# Patient Record
Sex: Male | Born: 1995 | Race: Black or African American | Hispanic: No | Marital: Single | Smoking: Never smoker
Health system: Southern US, Community
[De-identification: ages and names within clinical notes are randomized; demographics above are authoritative.]

## PROBLEM LIST (undated history)

## (undated) DIAGNOSIS — S83209A Unspecified tear of unspecified meniscus, current injury, unspecified knee, initial encounter: Secondary | ICD-10-CM

## (undated) DIAGNOSIS — R55 Syncope and collapse: Secondary | ICD-10-CM

## (undated) HISTORY — PX: NO PAST SURGERIES: SHX2092

## (undated) HISTORY — DX: Unspecified tear of unspecified meniscus, current injury, unspecified knee, initial encounter: S83.209A

## (undated) HISTORY — DX: Syncope and collapse: R55

---
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
## 2016-04-26 ENCOUNTER — Other Ambulatory Visit: Payer: Self-pay | Admitting: Orthopedic Surgery

## 2016-04-26 DIAGNOSIS — M25561 Pain in right knee: Secondary | ICD-10-CM

## 2016-04-28 ENCOUNTER — Ambulatory Visit
Admission: RE | Admit: 2016-04-28 | Discharge: 2016-04-28 | Disposition: A | Discharge status: Home / Self Care | Payer: Self-pay | Source: Ambulatory Visit | Attending: Orthopedic Surgery | Admitting: Orthopedic Surgery

## 2016-04-28 DIAGNOSIS — M25561 Pain in right knee: Secondary | ICD-10-CM

## 2016-05-05 ENCOUNTER — Emergency Department (HOSPITAL_COMMUNITY)
Admission: EM | Admit: 2016-05-05 | Discharge: 2016-05-06 | Disposition: A | Discharge status: Home / Self Care | Payer: PPO | Attending: Emergency Medicine | Admitting: Emergency Medicine

## 2016-05-05 ENCOUNTER — Emergency Department (HOSPITAL_COMMUNITY): Payer: PPO

## 2016-05-05 ENCOUNTER — Encounter (HOSPITAL_COMMUNITY): Payer: Self-pay

## 2016-05-05 DIAGNOSIS — R55 Syncope and collapse: Secondary | ICD-10-CM | POA: Insufficient documentation

## 2016-05-05 DIAGNOSIS — Y929 Unspecified place or not applicable: Secondary | ICD-10-CM | POA: Diagnosis not present

## 2016-05-05 DIAGNOSIS — S0993XA Unspecified injury of face, initial encounter: Secondary | ICD-10-CM | POA: Diagnosis present

## 2016-05-05 DIAGNOSIS — Z79899 Other long term (current) drug therapy: Secondary | ICD-10-CM | POA: Insufficient documentation

## 2016-05-05 DIAGNOSIS — Y9301 Activity, walking, marching and hiking: Secondary | ICD-10-CM | POA: Insufficient documentation

## 2016-05-05 DIAGNOSIS — S0512XA Contusion of eyeball and orbital tissues, left eye, initial encounter: Secondary | ICD-10-CM | POA: Insufficient documentation

## 2016-05-05 DIAGNOSIS — W109XXA Fall (on) (from) unspecified stairs and steps, initial encounter: Secondary | ICD-10-CM | POA: Insufficient documentation

## 2016-05-05 DIAGNOSIS — Y999 Unspecified external cause status: Secondary | ICD-10-CM | POA: Insufficient documentation

## 2016-05-05 LAB — BASIC METABOLIC PANEL
ANION GAP: 9 (ref 5–15)
BUN: 10 mg/dL (ref 6–20)
CALCIUM: 9.2 mg/dL (ref 8.9–10.3)
CO2: 24 mmol/L (ref 22–32)
CREATININE: 1.31 mg/dL — AB (ref 0.61–1.24)
Chloride: 104 mmol/L (ref 101–111)
GFR calc Af Amer: >60 mL/min (ref >60)
GLUCOSE: 98 mg/dL (ref 65–99)
Potassium: 3.4 mmol/L — ABNORMAL LOW (ref 3.5–5.1)
Sodium: 137 mmol/L (ref 135–145)

## 2016-05-05 LAB — ETHANOL

## 2016-05-05 LAB — CBC
HCT: 42.6 % (ref 39.0–52.0)
Hemoglobin: 14.1 g/dL (ref 13.0–17.0)
MCH: 27.9 pg (ref 26.0–34.0)
MCHC: 33.1 g/dL (ref 30.0–36.0)
MCV: 84.2 fL (ref 78.0–100.0)
PLATELETS: 252 10*3/uL (ref 150–400)
RBC: 5.06 MIL/uL (ref 4.22–5.81)
RDW: 12.8 % (ref 11.5–15.5)
WBC: 6.3 10*3/uL (ref 4.0–10.5)

## 2016-05-05 MED ORDER — SODIUM CHLORIDE 0.9 % IV BOLUS (SEPSIS)
1000.0000 mL | Freq: Once | INTRAVENOUS | Status: AC
  Administered 2016-05-05: 1000 mL via INTRAVENOUS

## 2016-05-05 NOTE — ED Notes (Signed)
Pt transported to CT ?

## 2016-05-05 NOTE — ED Triage Notes (Signed)
Pt brought in by [facility]. Pt was walking down stairs when he became diaphoretic and patient had syncopal episode with seizure like activity following as per EMS. Per EMS, pt was caught before hitting floor and patient was confused on transport. Pt was tachycardic at 175 on transport and EMS performed vagal maneuvers to lower Hr which were effective.  Pt denies head strike or any other injuries. Pt alert and oriented with no past medical hx.

## 2016-05-05 NOTE — ED Provider Notes (Signed)
[facility]-EMERGENCY DEPT Provider Note   CSN: [id number] Arrival date & time: 05/05/16  2212     History   Chief Complaint Chief Complaint  Patient presents with  . Loss of Consciousness    HPI Brandon Chapman is a 20 y.o. male.  HPI   Patient has no significant PMH, weights 147.4 kg comes to the ED after a syncopal episode. He was at the [employer] basketball game, reports it being an intense game and really hot, pt was sweating. After the game as they were leaving he was going to go down the stairs and the last thing the patient remembers is feeling weak and then passing out. The patients friend who witnessed the episode says that his eyes rolled in the back of his head and that his legs were twitching, his friend could not describe this any better because he says he was looking at his phone to call 9-1-1. The patient hit the left side of his face when he fell. After 2-3 minutes he woke up, and then friend says he fell asleep snoring, then he woke up and was laughing, and then fell asleep snoring. Patient only remembers waking up in ambulance. Now at baseline with no complaints. Family is here, no fm hx of seizures. Mom says she has hx of multiple syncopal episodes at work but no etiology has been identified.    History reviewed. No pertinent past medical history.  Patient Active Problem List   Diagnosis Date Noted  . Right ankle sprain 03/26/2011    History reviewed. No pertinent surgical history.     Home Medications    Prior to Admission medications   Not on File    Family History Family History  Problem Relation Age of Onset  . Hypertension Father   . Sudden death Neg Hx   . Hyperlipidemia Neg Hx   . Heart attack Neg Hx   . Diabetes Neg Hx     Social History Social History  Substance Use Topics  . Smoking status: Never Smoker  . Smokeless tobacco: Never Used  . Alcohol use No     Allergies   Patient has no known allergies.   Review of Systems Review of  Systems  Review of Systems  Constitutional: Negative for fever and chills.  HENT: Negative for neck pain.  Cardiovascular: Negative for leg swelling.  Gastrointestinal: Negative for nausea and vomiting.  No incontinence of bowel  Genitourinary: Negative for difficulty urinating.  No incontinence or retention  Musculoskeletal: negative for back pain.  Skin: Negative for rash.  Neurological: negative for numbness. Negative for weakness.   Physical Exam Updated Vital Signs BP 115/57   Pulse 71   Temp 98.2 F (36.8 C) (Oral)   Resp 23   Ht 6\' 1"  (1.854 m)   Wt (!) 147.4 kg   SpO2 98%   BMI 42.88 kg/m   Physical Exam  Constitutional: He appears well-developed and well-nourished. No distress.  HENT:  Head: Normocephalic. Head is with contusion (left periorbital).  Right Ear: Tympanic membrane and ear canal normal.  Left Ear: Tympanic membrane and ear canal normal.  Nose: Nose normal.  Mouth/Throat: Uvula is midline, oropharynx is clear and moist and mucous membranes are normal.  Eyes: Pupils are equal, round, and reactive to light.  Neck: Normal range of motion. Neck supple.  Cardiovascular: Normal rate and regular rhythm.   Pulmonary/Chest: Effort normal.  Abdominal: Soft.  No signs of abdominal distention  Musculoskeletal:  No LE swelling  Neurological:  He is alert.  Acting at baseline Cranial nerves grossly intact on exam. Pt alert and oriented x 3 Upper and lower extremity strength is symmetrical and physiologic Normal muscular tone No facial droop Coordination intact, no limb ataxia,No pronator drift   Skin: Skin is warm and dry. No rash noted.  Nursing note and vitals reviewed.     ED Treatments / Results  Labs (all labs ordered are listed, but only abnormal results are displayed) Labs Reviewed  BASIC METABOLIC PANEL - Abnormal; Notable for the following:       Result Value   Potassium 3.4 (*)    Creatinine, Ser 1.31 (*)    All other components within  normal limits  URINALYSIS, ROUTINE W REFLEX MICROSCOPIC (NOT AT [facility]) - Abnormal; Notable for the following:    Protein, ur 100 (*)    All other components within normal limits  RAPID URINE DRUG SCREEN, HOSP PERFORMED - Abnormal; Notable for the following:    Tetrahydrocannabinol POSITIVE (*)    All other components within normal limits  URINE MICROSCOPIC-ADD ON - Abnormal; Notable for the following:    Bacteria, UA RARE (*)    Casts HYALINE CASTS (*)    Crystals CA OXALATE CRYSTALS (*)    All other components within normal limits  CBC  ETHANOL    EKG  EKG Interpretation  Date/Time:  Wednesday May 05 2016 22:22:53 EST Ventricular Rate:  97 PR Interval:    QRS Duration: 96 QT Interval:  333 QTC Calculation: 423 R Axis:   -66 Text Interpretation:  Sinus rhythm LAD, consider left anterior fascicular block ST elev, probable normal early repol pattern No old tracing to compare Confirmed by WARD,  DO, KRISTEN ([id number]) on 05/06/2016 12:56:26 AM       Radiology Dg Chest 2 View  Result Date: 05/06/2016 CLINICAL DATA:  Acute onset of shortness of breath. Syncope. Initial encounter. EXAM: CHEST  2 VIEW COMPARISON:  None. FINDINGS: The lungs are well-aerated. Pulmonary vascularity is at the upper limits of normal. There is no evidence of focal opacification, pleural effusion or pneumothorax. The heart is borderline normal in size. No acute osseous abnormalities are seen. IMPRESSION: No acute cardiopulmonary process seen. Electronically Signed   By: Roanna RaiderJeffery  Chang M.D.   On: 05/06/2016 00:26   Ct Head Wo Contrast  Result Date: 05/05/2016 CLINICAL DATA:  Syncope. EXAM: CT HEAD WITHOUT CONTRAST TECHNIQUE: Contiguous axial images were obtained from the base of the skull through the vertex without intravenous contrast. COMPARISON:  None. FINDINGS: Brain: No evidence of acute infarction, hemorrhage, hydrocephalus, extra-axial collection or mass lesion/mass effect. Gray matter and white  matter appear unremarkable, with normal differentiation. Vascular: No hyperdense vessel or unexpected calcification. Skull: Normal. Negative for fracture or focal lesion. Sinuses/Orbits: No acute finding. Other: None. IMPRESSION: Normal brain. Electronically Signed   By: Ellery Plunkaniel R Mitchell M.D.   On: 05/05/2016 23:53    Procedures Procedures (including critical care time)  Medications Ordered in ED Medications  sodium chloride 0.9 % bolus 1,000 mL (0 mLs Intravenous Stopped 05/06/16 0142)     Initial Impression / Assessment and Plan / ED Course  I have reviewed the triage vital signs and the nursing notes.  Pertinent labs & imaging results that were available during my care of the patient were reviewed by me and considered in my medical decision making (see chart for details).  Clinical Course     The patient's head CT and chest x-ray are unremarkable. BMP and CBC overall  unremarkable urinalysis unremarkable. EKG reviewed with Dr. Elesa MassedWard no concerning findings. Based on history of present illness is unclear whether patient had full episode, seizure, cardiac event. Dr. Elesa MassedWard and I have low suspicion that seizure activity or cardiac event took place and sounds like the patient got overheated and passed out. We will recommend he follow-up with cardiology and neurology and referrals will be given today. Also given the patient and parents the recommendation that he does not drive until cleared by neurology for 6 months being seizure or syncope free.  They voiced understanding about the dangers of him operating heavy machinery. Discussed return precautions.  Final Clinical Impressions(s) / ED Diagnoses   Final diagnoses:  Syncope, unspecified syncope type    New Prescriptions New Prescriptions   No medications on file     Marlon Peliffany Olden Klauer, PA-C 05/06/16 0204    Layla MawKristen N Ward, DO 05/06/16 [phone]

## 2016-05-06 ENCOUNTER — Emergency Department (HOSPITAL_COMMUNITY): Payer: PPO

## 2016-05-06 DIAGNOSIS — S0512XA Contusion of eyeball and orbital tissues, left eye, initial encounter: Secondary | ICD-10-CM | POA: Diagnosis not present

## 2016-05-06 LAB — RAPID URINE DRUG SCREEN, HOSP PERFORMED
Amphetamines: NOT DETECTED
BENZODIAZEPINES: NOT DETECTED
Barbiturates: NOT DETECTED
COCAINE: NOT DETECTED
OPIATES: NOT DETECTED
Tetrahydrocannabinol: POSITIVE — AB

## 2016-05-06 LAB — URINE MICROSCOPIC-ADD ON: SQUAMOUS EPITHELIAL / LPF: NONE SEEN

## 2016-05-06 LAB — URINALYSIS, ROUTINE W REFLEX MICROSCOPIC
Bilirubin Urine: NEGATIVE
GLUCOSE, UA: NEGATIVE mg/dL
Hgb urine dipstick: NEGATIVE
KETONES UR: NEGATIVE mg/dL
LEUKOCYTES UA: NEGATIVE
NITRITE: NEGATIVE
PROTEIN: 100 mg/dL — AB
Specific Gravity, Urine: 1.028 (ref 1.005–1.030)
pH: 7 (ref 5.0–8.0)

## 2016-05-06 NOTE — ED Notes (Signed)
Pt stable, understands discharge instructions, and reasons for return.   

## 2016-05-10 ENCOUNTER — Ambulatory Visit (INDEPENDENT_AMBULATORY_CARE_PROVIDER_SITE_OTHER): Payer: PRIVATE HEALTH INSURANCE | Admitting: Neurology

## 2016-05-10 ENCOUNTER — Encounter: Payer: Self-pay | Admitting: Neurology

## 2016-05-10 VITALS — BP 146/73 | HR 57 | Ht 73.0 in | Wt 334.0 lb

## 2016-05-10 DIAGNOSIS — R55 Syncope and collapse: Secondary | ICD-10-CM | POA: Diagnosis not present

## 2016-05-10 NOTE — Patient Instructions (Signed)
   We will get MRI of the brain and get an EEG study. 

## 2016-05-10 NOTE — Progress Notes (Signed)
Reason for visit: Syncope  Referring physician: [place]  Brandon Chapman is a 20 y.o. male  History of present illness:  Brandon Chapman is a 20 year old right-handed black male with a history of a syncopal event that occurred on 05/05/2016. The patient was at a sports game, in a hot stadium. The patient was standing during the game, after the game he went to leave, but he shortly felt somewhat lightheaded, he felt numbness on the right face, arm, and leg and felt as if his right face was drooping. The patient recalls dimming of vision, and then he subsequently lost consciousness. The patient was noted to be diaphoretic, he was with his friend who noted that he was twitching on the ground, he seemed to come in and out of consciousness with some agitation. When EMS arrived, they noted that the heart rate was significantly increased. The patient was brought to the hospital, a CT scan of the brain was done. The patient had struck the left brow with the fall. The patient denies any prior episodes of blacking out, he denies any history of significant head trauma. His mother has had several syncopal events, the etiology was never determined. The patient had blood work that was unremarkable, a urine drug screen was positive for THC, otherwise negative. The patient is sent to this office for an evaluation.  Past Medical History:  Diagnosis Date  . Torn meniscus    Right knee    Past Surgical History:  Procedure Laterality Date  . NO PAST SURGERIES      Family History  Problem Relation Age of Onset  . Hypertension Father   . Asthma Mother   . Diabetes Maternal Grandmother   . Sudden death Neg Hx   . Hyperlipidemia Neg Hx   . Heart attack Neg Hx     Social history:  reports that he has never smoked. He has never used smokeless tobacco. He reports that he drinks alcohol. He reports that he does not use drugs.  Medications:  Prior to Admission medications   Medication Sig Start Date End Date  Taking? Authorizing Provider  ibuprofen (ADVIL,MOTRIN) 200 MG tablet Take 200 mg by mouth every 6 (six) hours as needed.   Yes Historical Provider, MD     No Known Allergies  ROS:  Out of a complete 14 system review of symptoms, the patient complains only of the following symptoms, and all other reviewed systems are negative.  Blurred vision Shortness of breath Joint pain, muscle cramps Snoring  Blood pressure (!) 146/73, pulse (!) 57, height 6\' 1"  (1.854 m), weight (!) 334 lb (151.5 kg).  Physical Exam  General: The patient is alert and cooperative at the time of the examination. The patient is moderately obese.  Eyes: Pupils are equal, round, and reactive to light. Discs are flat bilaterally.  Neck: The neck is supple, no carotid bruits are noted.  Respiratory: The respiratory examination is clear.  Cardiovascular: The cardiovascular examination reveals a regular rate and rhythm, no obvious murmurs or rubs are noted.  Skin: Extremities are without significant edema.  Neurologic Exam  Mental status: The patient is alert and oriented x 3 at the time of the examination. The patient has apparent normal recent and remote memory, with an apparently normal attention span and concentration ability.  Cranial nerves: Facial symmetry is present. There is good sensation of the face to pinprick and soft touch bilaterally. The strength of the facial muscles and the muscles to head turning  and shoulder shrug are normal bilaterally. Speech is well enunciated, no aphasia or dysarthria is noted. Extraocular movements are full. Visual fields are full. The tongue is midline, and the patient has symmetric elevation of the soft palate. No obvious hearing deficits are noted.  Motor: The motor testing reveals 5 over 5 strength of all 4 extremities. Good symmetric motor tone is noted throughout.  Sensory: Sensory testing is intact to pinprick, soft touch, vibration sensation, and position sense on  all 4 extremities. No evidence of extinction is noted.  Coordination: Cerebellar testing reveals good finger-nose-finger and heel-to-shin bilaterally.  Gait and station: Gait is normal. Tandem gait is normal. Romberg is negative. No drift is seen.  Reflexes: Deep tendon reflexes are symmetric and normal bilaterally. Toes are downgoing bilaterally.   Assessment/Plan:  1. Syncope versus seizure  The patient has had an event of loss of consciousness, it was associated with diaphoresis and dimming of vision prior to lost consciousness. These features appear to be consistent with vasovagal syncope, but the patient was apparently noted to have jerking or twitching after falling to the ground. He also had associated agitation. A seizure does need to be considered. Given the elevated heart rate noted by EMS, the patient should undergo a cardiology evaluation as well. The patient will be set up for MRI of the brain, and an EEG study. He will follow-up in 5 months, he will let me know if he has another syncopal event. He is not to drive for 6 months following the episode above.  C. Keith Willis MD 05/10/2016 9:44 AM  [facility] [address] [place], [place] [id number]  Phone [phone] Fax [phone]

## 2016-05-12 ENCOUNTER — Ambulatory Visit (INDEPENDENT_AMBULATORY_CARE_PROVIDER_SITE_OTHER): Payer: PRIVATE HEALTH INSURANCE | Admitting: Neurology

## 2016-05-12 ENCOUNTER — Telehealth: Payer: Self-pay | Admitting: Neurology

## 2016-05-12 DIAGNOSIS — R55 Syncope and collapse: Secondary | ICD-10-CM | POA: Diagnosis not present

## 2016-05-12 NOTE — Procedures (Signed)
    History:  Brandon Chapman is a 20 year old gentleman with a history of a blackout event associated with diaphoresis, but the patient may have had some jerking with the event. The patient is being evaluated for syncope versus seizure.  This is a routine EEG. No skull defects are noted. Medications include ibuprofen.   EEG classification: Normal awake and drowsy  Description of the recording: The background rhythms of this recording consists of a fairly well modulated medium amplitude alpha rhythm of 10 Hz that is reactive to eye opening and closure. As the record progresses, the patient appears to remain in the waking state throughout the recording. Photic stimulation was performed, resulting in a bilateral and symmetric photic driving response. Hyperventilation was also performed, resulting in a minimal buildup of the background rhythm activities without significant slowing seen. Toward the end of the recording, the patient enters the drowsy state with slight symmetric slowing seen. The patient never enters stage II sleep. At no time during the recording does there appear to be evidence of spike or spike wave discharges or evidence of focal slowing. EKG monitor shows no evidence of cardiac rhythm abnormalities with a heart rate of 66.  Impression: This is a normal EEG recording in the waking and drowsy state. No evidence of ictal or interictal discharges are seen.

## 2016-05-12 NOTE — Telephone Encounter (Signed)
I called the patient. The EEG study was normal. MRI of the brain will be done.

## 2016-05-21 ENCOUNTER — Encounter: Payer: Self-pay | Admitting: Cardiology

## 2016-05-21 ENCOUNTER — Ambulatory Visit (INDEPENDENT_AMBULATORY_CARE_PROVIDER_SITE_OTHER): Payer: PRIVATE HEALTH INSURANCE | Admitting: Cardiology

## 2016-05-21 VITALS — BP 126/73 | HR 73 | Ht 73.0 in | Wt 337.0 lb

## 2016-05-21 DIAGNOSIS — R55 Syncope and collapse: Secondary | ICD-10-CM

## 2016-05-21 DIAGNOSIS — R9431 Abnormal electrocardiogram [ECG] [EKG]: Secondary | ICD-10-CM

## 2016-05-21 HISTORY — PX: TRANSTHORACIC ECHOCARDIOGRAM: SHX275

## 2016-05-21 HISTORY — PX: KNEE SURGERY: SHX244

## 2016-05-21 NOTE — Progress Notes (Signed)
PCP: Tarri FullerESCAJEDA, RICHARD, MD  Clinic Note: Chief Complaint  Patient presents with  . Hospitalization Follow-up    ER visit for Syncope    HPI: Brandon Chapman is a 20 y.o. male with a PMH below who presents today for cardiology evaluation for syncope. He went to the emergency room on November 15 for syncope. 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   The episode was witnessed with a friend noted seeing the patient begin to feel weak and the eyes rolled back and the back of his head. This was after watching an intense game basketball. There is suggestion that the patient felt numbness in his right face arm and leg and also some right face was drooping. He was pale and diaphoretic prior to loss of consciousness. On EMS arrival he was noted to be actually tachycardic. By his mother's report, she has had multiple syncopal episodes but no etiology determined. There does not seem to be a family history of seizures.  Brandon Chapman was seen in posterior follow-up by Dr. Anne HahnWillis from neurology on November 20. He has an MRI of the brain and EEG ordered. No driving for 6 months  Recent Hospitalizations: November 15 ER visit  Studies Reviewed: none  Interval History: Brandon Chapman presents today for follow-up of this event. He has not had any further episodes since that episode. 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 He has never had a history of syncope before, but his mother has had a syncopal history. He describes it as episode as occurring while watching an intense basketball game in a gym that was very hot. He had not really been eating and drinking very much that day. He is quick to note that he does not drink alcohol. But he does admit that he had not had much to eat, and especially much to drink from liquid standpoint that day. One again was over he started feeling lightheaded and dizzy as though he needed to get  some air. He went to triangle walk outside to get some air and he started leaning to his right side and following Brandon Chapman because the wall. He then apparently passed out, and does not recall much of what happened. By report he became belligerent with the security until he realized what was happening and woke up. Finally came to he was still feeling somewhat tired but regained consciousness and does have a short period of time which she does not remember exactly what happened. He says that his friends noticed his eyes rolling back as indicated above. He never hit his head on anything, so no trauma was involved.  He denied having any sensation whatsoever of any rapid irregular heartbeats or palpitations. He had no chest tightness or pressure just felt like he needed to catch his breath but not short of breath. No real unilateral weakness or numbness although he seemed to be favoring his right side of the left. Since the episode he has not had any more symptoms. He was able to go through the entire football year without any significant of chest tightness pressure or dyspnea.   No PND, orthopnea or edema  ROS: A comprehensive was performed.  Pertinent +/- above Review of Systems  Constitutional: Negative.   HENT: Negative.   Eyes: Negative.   Respiratory: Negative.   Cardiovascular:       Per HPI  Gastrointestinal: Negative for blood in stool and melena.  Genitourinary:  Negative for hematuria.  Skin: Negative.   Neurological:       Per HPI  Endo/Heme/Allergies: Negative.   Psychiatric/Behavioral: Negative for depression and memory loss. The patient is not nervous/anxious and does not have insomnia.   All other systems reviewed and are negative.   Past Medical History:  Diagnosis Date  . Torn meniscus    Right knee    Past Surgical History:  Procedure Laterality Date  . NO PAST SURGERIES     Prior to Admission medications   Medication Sig Start Date End Date Taking? Authorizing Provider   ibuprofen (ADVIL,MOTRIN) 200 MG tablet Take 200 mg by mouth every 6 (six) hours as needed for moderate pain.    Yes Historical Provider, MD   No Known Allergies   Social History   Social History  . Marital status: Single    Spouse name: N/A  . Number of children: 0  . Years of education: Some college   Occupational History  . [employer]    Social History Main Topics  . Smoking status: Never Smoker  . Smokeless tobacco: Never Used  . Alcohol use Yes     Comment: Occasional  . Drug use: No  . Sexual activity: No   Other Topics Concern  . None   Social History Scientist, research (medical)arrative   Student at [employer]   Right-handed   Caffeine: 2-3 glasses of tea per day   Family History  Problem Relation Age of Onset  . Hypertension Father   . Asthma Mother   . Diabetes Maternal Grandmother   . Healthy Brother   . Sudden death Neg Hx   . Hyperlipidemia Neg Hx   . Heart attack Neg Hx     Wt Readings from Last 3 Encounters:  05/21/16 (!) 152.9 kg (337 lb)  05/10/16 (!) 151.5 kg (334 lb)  05/05/16 (!) 147.4 kg (325 lb)    PHYSICAL EXAM BP 126/73 (BP Location: Right Arm)   Pulse 73   Ht 6\' 1"  (1.854 m)   Wt (!) 152.9 kg (337 lb)   BMI 44.46 kg/m  General appearance: alert, cooperative, appears stated age, no distress and "severely" obese (although he is quite muscular, making the BMI calculation inaccrate). Neck: no adenopathy, no carotid bruit and no JVD Lungs: clear to auscultation bilaterally, normal percussion bilaterally and non-labored Heart: regular rate and rhythm, S1 & S2 normal - but distant;  no obvious murmur, click, rub or gallop; unable to palpate PMI Abdomen: soft, non-tender; bowel sounds normal; no masses,  no organomegaly; Extremities: extremities normal, atraumatic, no cyanosis, or edema Pulses: 2+ and symmetric;  Skin: mobility and turgor normal, no evidence of bleeding or bruising and no lesions noted  Neurologic: Mental status: Alert, oriented,  thought content appropriate Cranial nerves: normal (II-XII grossly intact)    Adult ECG Report N/a From ER - SR 97, Left Axis Deviation. Diffuse ST Segment J-point elevation (early repolarization)  Other studies Reviewed: Additional studies/ records that were reviewed today include:  Recent Labs:     Chemistry      Component Value Date/Time   NA 137 05/05/2016 2245   K 3.4 (L) 05/05/2016 2245   CL 104 05/05/2016 2245   CO2 24 05/05/2016 2245   BUN 10 05/05/2016 2245   CREATININE 1.31 (H) 05/05/2016 2245      Component Value Date/Time   CALCIUM 9.2 05/05/2016 2245      ASSESSMENT / PLAN: Problem List Items Addressed This Visit    Syncope  and collapse - Primary    I'm not really clear what the etiology of this was. By day 5 had to make a guess it is probably related to him being somewhat dehydrated in a situation where is very hot and he simply had a vagal episode with dehydration. 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 Plan: Check 2-D echocardiogram.  I don't think we need to check carotid Dopplers because is very unlikely to develop carotid disease, nor is her carotids very helpful or relevant for syncope. Does not sound arrhythmogenic, I don't think we need to have a monitor.      Relevant Orders   ECHOCARDIOGRAM COMPLETE   Abnormal EKG    His EKG looks like early report, however this can also be seen with LVH. It is unusual for him to have left axis deviation and a somewhat abnormal EKG. Therefore we are checking a 2-D echocardiogram to exclude structural abnormality      Relevant Orders   ECHOCARDIOGRAM COMPLETE      Current medicines are reviewed at length with the patient today. (+/- concerns) none The following changes have been made: none  Patient Instructions  SCHEDULE AT [address] Your  physician has requested that you have an echocardiogram. Echocardiography is a painless test that uses sound waves to create images of your heart. It provides your doctor with information about the size and shape of your heart and how well your heart's chambers and valves are working. This procedure takes approximately one hour. There are no restrictions for this procedure. ( HAVING  KNEE SURGERY DEC 20 ,2017 WITH DR Thurston Hole)  Your physician recommends that you schedule a follow-up appointment in: 1 MONTH  WITH DR Jediah Horger.    Studies Ordered:   Orders Placed This Encounter  Procedures  . ECHOCARDIOGRAM COMPLETE      Bryan Lemma, M.D., M.S. Interventional Cardiologist   Pager # [phone] Phone # [phone] [address] [place], [place] [address]

## 2016-05-21 NOTE — Patient Instructions (Addendum)
SCHEDULE AT [address] Your physician has requested that you have an echocardiogram. Echocardiography is a painless test that uses sound waves to create images of your heart. It provides your doctor with information about the size and shape of your heart and how well your heart's chambers and valves are working. This procedure takes approximately one hour. There are no restrictions for this procedure. ( HAVING  KNEE SURGERY DEC 20 ,2017 WITH DR Thurston HoleWAINER)  Your physician recommends that you schedule a follow-up appointment in: 1 MONTH  WITH DR HARDING.

## 2016-05-23 ENCOUNTER — Encounter: Payer: Self-pay | Admitting: Cardiology

## 2016-05-23 DIAGNOSIS — R9431 Abnormal electrocardiogram [ECG] [EKG]: Secondary | ICD-10-CM | POA: Insufficient documentation

## 2016-05-23 NOTE — Assessment & Plan Note (Signed)
His EKG looks like early report, however this can also be seen with LVH. It is unusual for him to have left axis deviation and a somewhat abnormal EKG. Therefore we are checking a 2-D echocardiogram to exclude structural abnormality

## 2016-05-23 NOTE — Assessment & Plan Note (Signed)
I'm not really clear what the etiology of this was. By day 5 had to make a guess it is probably related to him being somewhat dehydrated in a situation where is very hot and he simply had a vagal episode with dehydration. 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 Plan: Check 2-D echocardiogram.  I don't think we need to check carotid Dopplers because is very unlikely to develop carotid disease, nor is her carotids very helpful or relevant for syncope. Does not sound arrhythmogenic, I don't think we need to have a monitor.

## 2016-05-24 ENCOUNTER — Ambulatory Visit (HOSPITAL_COMMUNITY)
Admission: RE | Admit: 2016-05-24 | Discharge: 2016-05-24 | Disposition: A | Discharge status: Home / Self Care | Payer: BC Managed Care – PPO | Source: Ambulatory Visit | Attending: Cardiology | Admitting: Cardiology

## 2016-05-24 DIAGNOSIS — R9431 Abnormal electrocardiogram [ECG] [EKG]: Secondary | ICD-10-CM | POA: Diagnosis not present

## 2016-05-24 DIAGNOSIS — R55 Syncope and collapse: Secondary | ICD-10-CM | POA: Diagnosis not present

## 2016-05-24 LAB — ECHOCARDIOGRAM COMPLETE
E decel time: 264 msec
EERAT: 5.83
FS: 28 % (ref 28–44)
IVS/LV PW RATIO, ED: 0.91
LA diam end sys: 40 mm
LA diam index: 1.49 cm/m2
LA vol A4C: 74.6 ml
LA vol index: 25.4 mL/m2
LASIZE: 40 mm
LAVOL: 68.3 mL
LDCA: 4.15 cm2
LV E/e' medial: 5.83
LV PW d: 12.9 mm — AB (ref 0.6–1.1)
LV TDI E'LATERAL: 13.4
LV TDI E'MEDIAL: 11.2
LV e' LATERAL: 13.4 cm/s
LVEEAVG: 5.83
LVOTD: 23 mm
MV Dec: 264
MV Peak grad: 2 mmHg
MV pk A vel: 41.7 m/s
MVPKEVEL: 78.1 m/s
RV LATERAL S' VELOCITY: 17.4 cm/s
TAPSE: 27.5 mm

## 2016-05-24 NOTE — Progress Notes (Signed)
Echo results: Good news: Essentially normal echocardiogram and normal pump function and normal valve function.  EF: 55-60% -- this is in the normal range. No signs of hypertrophic cardiomyopathy  No signs to suggest heart attack..No regional wall motion abnormalities  I think the passout spell was more related to what we talked about the clinic.  No reason to not go forward with surgery.  Bryan Lemmaavid Abigail Marsiglia, MD

## 2016-05-24 NOTE — Progress Notes (Signed)
  Echocardiogram 2D Echocardiogram has been performed.  Delcie RochENNINGTON, Charlesa Ehle 05/24/2016, 9:14 AM

## 2016-05-25 ENCOUNTER — Other Ambulatory Visit (HOSPITAL_COMMUNITY): Payer: PRIVATE HEALTH INSURANCE

## 2016-05-29 ENCOUNTER — Ambulatory Visit
Admission: RE | Admit: 2016-05-29 | Discharge: 2016-05-29 | Disposition: A | Discharge status: Home / Self Care | Payer: PRIVATE HEALTH INSURANCE | Source: Ambulatory Visit | Attending: Neurology | Admitting: Neurology

## 2016-05-29 DIAGNOSIS — R55 Syncope and collapse: Secondary | ICD-10-CM | POA: Diagnosis not present

## 2016-05-29 MED ORDER — GADOBENATE DIMEGLUMINE 529 MG/ML IV SOLN
20.0000 mL | Freq: Once | INTRAVENOUS | Status: AC | PRN
  Administered 2016-05-29: 20 mL via INTRAVENOUS

## 2016-05-30 ENCOUNTER — Encounter: Payer: Self-pay | Admitting: Neurology

## 2016-05-30 ENCOUNTER — Telehealth: Payer: Self-pay | Admitting: Neurology

## 2016-05-30 NOTE — Telephone Encounter (Signed)
I called the patient. I talked with the mother. The MRI of the brain is OK. EEG was normal and the patient has had a cardiology eval. OK from a neurologic standpoint for the patient to return to driving, I have a low suspicion for seizures. I suspect that the episode was a vasovagal event.   MRI brain 05/30/16:  IMPRESSION:  This MRI of the brain with and without contrast shows the following: 1.    Brain parenchyma appears normal before and after contrast. 2.    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    There are no acute findings.

## 2016-07-13 ENCOUNTER — Ambulatory Visit (INDEPENDENT_AMBULATORY_CARE_PROVIDER_SITE_OTHER): Payer: PRIVATE HEALTH INSURANCE | Admitting: Cardiology

## 2016-07-13 ENCOUNTER — Encounter: Payer: Self-pay | Admitting: Cardiology

## 2016-07-13 DIAGNOSIS — R55 Syncope and collapse: Secondary | ICD-10-CM | POA: Diagnosis not present

## 2016-07-13 NOTE — Progress Notes (Signed)
PCP: Tarri Fuller, MD  Clinic Note: Chief Complaint  Patient presents with  . Follow-up    Syncope. Follow-up echo    HPI: Brandon Chapman is a 21 y.o. male with a PMH below who presents today for cardiology evaluation for syncope. He went to the emergency room on November 15 for syncope. 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   The episode was witnessed with a friend noted seeing the patient begin to feel weak and the eyes rolled back and the back of his head. This was after watching an intense game basketball. There is suggestion that the patient felt numbness in his right face arm and leg and also some right face was drooping. He was pale and diaphoretic prior to loss of consciousness. On EMS arrival he was noted to be actually tachycardic. By his mother's report, she has had multiple syncopal episodes but no etiology determined. There does not seem to be a family history of seizures.  Brandon Lard was seen in posterior follow-up by Dr. Anne Hahn from neurology on November 20. He has an MRI of the brain and EEG ordered. No driving for 6 months  Recent Hospitalizations: November 15 ER visit  Studies Reviewed:   2-D Echocardiogram: Normal echo with normal valve function. EF 55-60%. No hypertrophic cardiomyopathy. No regional wall motion abnormality. Normal diastolic function.   Interval History: Brandon Chapman presents today for follow-up of this event After having had an echocardiogram that was relatively normal. He is already had his knee surgery and doing well ever since. He is not had any further episodes of lightheadedness, dizziness or wooziness. No syncope or near syncope. He is ensuring that he does make a good effort to maintain his oral hydration. Otherwise he is not had any other cardiac symptoms of rapid irregular heartbeat/palpitations. No chest tightness or pressure with rest or exertion. No PND, orthopnea or edema. No TIA  or amaurosis fugax symptoms.  No PND, orthopnea or edema  ROS: A comprehensive was performed.  Pertinent +/- above Review of Systems  Constitutional: Negative.   HENT: Negative.   Eyes: Negative.   Respiratory: Negative.   Cardiovascular:       Per HPI  Gastrointestinal: Negative for blood in stool and melena.  Genitourinary: Negative for hematuria.  Skin: Negative.   Neurological:       Per HPI  Endo/Heme/Allergies: Negative.   Psychiatric/Behavioral: Negative for depression and memory loss. The patient is not nervous/anxious and does not have insomnia.   All other systems reviewed and are negative.   Past Medical History:  Diagnosis Date  . Torn meniscus    Right knee  . Vasovagal syncope 05/2006    - Overheated, dehydrated in crouded Gym    Past Surgical History:  Procedure Laterality Date  . KNEE SURGERY  05/2016   Arthroscopic - right knee  . NO PAST SURGERIES    . TRANSTHORACIC ECHOCARDIOGRAM  05/2016   Normal echo with normal valve function. EF 55-60%. No hypertrophic cardiomyopathy. No regional wall motion abnormality. Normal diastolic function.    Prior to Admission medications   Medication Sig Start Date End Date Taking? Authorizing Provider  ibuprofen (ADVIL,MOTRIN) 200 MG tablet Take 200 mg by mouth every 6 (six) hours as needed for moderate pain.    Yes Historical Provider, MD   No Known Allergies   Social History   Social History  . Marital status: Single    Spouse name: N/A  .  Number of children: 0  . Years of education: Some college   Occupational History  . [employer]    Social History Main Topics  . Smoking status: Never Smoker  . Smokeless tobacco: Never Used  . Alcohol use Yes     Comment: Occasional  . Drug use: No  . Sexual activity: No   Other Topics Concern  . None   Social History Scientist, research (medical)arrative   Student at [employer]   Right-handed   Caffeine: 2-3 glasses of tea per day   Family History  Problem Relation Age of  Onset  . Hypertension Father   . Asthma Mother   . Diabetes Maternal Grandmother   . Healthy Brother   . Sudden death Neg Hx   . Hyperlipidemia Neg Hx   . Heart attack Neg Hx     Wt Readings from Last 3 Encounters:  07/13/16 (!) 160.8 kg (354 lb 9.6 oz)  05/21/16 (!) 152.9 kg (337 lb)  05/10/16 (!) 151.5 kg (334 lb)    PHYSICAL EXAM BP 132/73   Pulse 75   Ht 6\' 1"  (1.854 m)   Wt (!) 160.8 kg (354 lb 9.6 oz)   SpO2 98%   BMI 46.78 kg/m  General appearance: alert, cooperative, appears stated age, no distress and "severely" obese (although he is quite muscular, making the BMI calculation inaccrate). Neck: no adenopathy, no carotid bruit and no JVD Lungs: clear to auscultation bilaterally, normal percussion bilaterally and non-labored Heart: regular rate and rhythm, S1 & S2 normal - but distant;  no obvious murmur, click, rub or gallop; unable to palpate PMI Abdomen: soft, non-tender; bowel sounds normal; no masses,  no organomegaly; Extremities: extremities normal, atraumatic, no cyanosis, or edema Pulses: 2+ and symmetric;  Neurologic: Mental status: Alert, oriented, thought content appropriate    Adult ECG Report N/a  Other studies Reviewed: Additional studies/ records that were reviewed today include:  Recent Labs:  N/A   ASSESSMENT / PLAN: Problem List Items Addressed This Visit    Syncope and collapse    Most likely etiology is related to vagal syncope with combined dehydration, overheating in a crowded gymnasium -- normal echo excludes hypertrophic cardiomyopathy  Plan for now is to ensure adequate hydration. Avoid similar situations  Follow-up when necessary         Current medicines are reviewed at length with the patient today. (+/- concerns) none The following changes have been made: none  Patient Instructions  NO CHANGES  IN CURRENT TREATMENT     Your physician recommends that you schedule a follow-up appointment AS NEEDED  Studies Ordered:     No orders of the defined types were placed in this encounter.     Bryan Lemmaavid Harding, M.D., M.S. Interventional Cardiologist   Pager # [phone] Phone # [phone] [address] [place], [place] [id number]

## 2016-07-13 NOTE — Patient Instructions (Signed)
NO CHANGES  IN CURRENT TREATMENT     Your physician recommends that you schedule a follow-up appointment AS NEEDED

## 2016-07-14 ENCOUNTER — Encounter: Payer: Self-pay | Admitting: Cardiology

## 2016-07-14 NOTE — Assessment & Plan Note (Signed)
Most likely etiology is related to vagal syncope with combined dehydration, overheating in a crowded gymnasium -- normal echo excludes hypertrophic cardiomyopathy  Plan for now is to ensure adequate hydration. Avoid similar situations  Follow-up when necessary

## 2016-09-30 ENCOUNTER — Encounter: Payer: Self-pay | Admitting: Neurology

## 2016-09-30 ENCOUNTER — Telehealth: Payer: Self-pay | Admitting: Neurology

## 2016-09-30 ENCOUNTER — Ambulatory Visit: Payer: PRIVATE HEALTH INSURANCE | Admitting: Neurology

## 2016-09-30 NOTE — Telephone Encounter (Signed)
This patient did not show for a revisit appointment. 

## 2016-10-08 ENCOUNTER — Ambulatory Visit: Payer: PRIVATE HEALTH INSURANCE | Admitting: Neurology

## 2017-02-02 IMAGING — CT CT HEAD W/O CM
4 series · 16 of 47 positions shown, 18 images · non-contrast
Comparison: None.

CLINICAL DATA: Syncope.

EXAM:
CT HEAD WITHOUT CONTRAST
TECHNIQUE: Contiguous axial images were obtained from the base of the skull
through the vertex without intravenous contrast.

[Series 2: head without · axial · non-contrast · 0.46mm/px · z∈[-104,+21]mm · 7 of 35 slices shown, 9 images]
[im 5/35  brain]
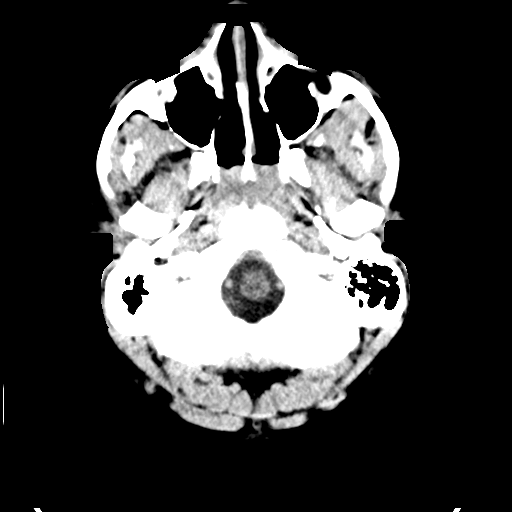
[im 5/35  bone]
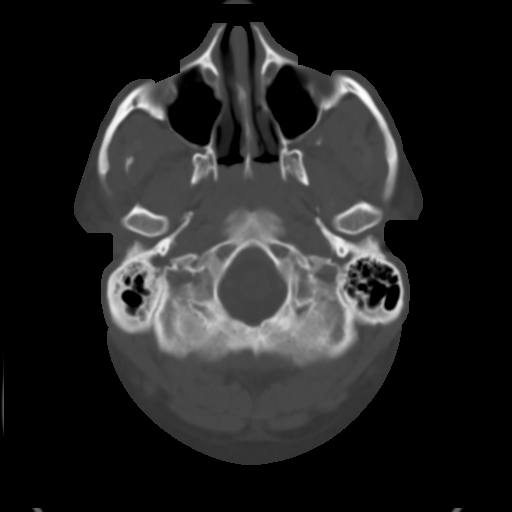
[im 9/35  brain]
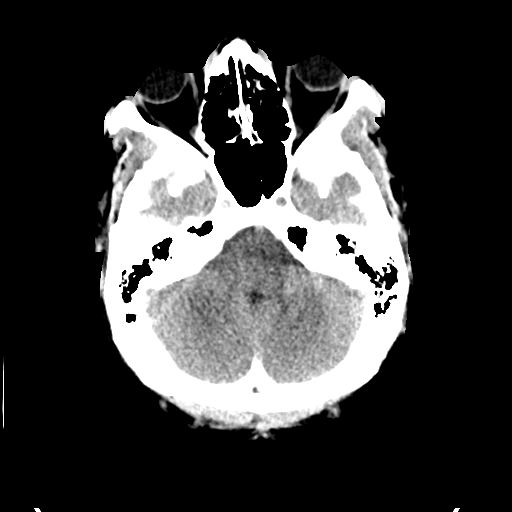
[im 13/35  brain]
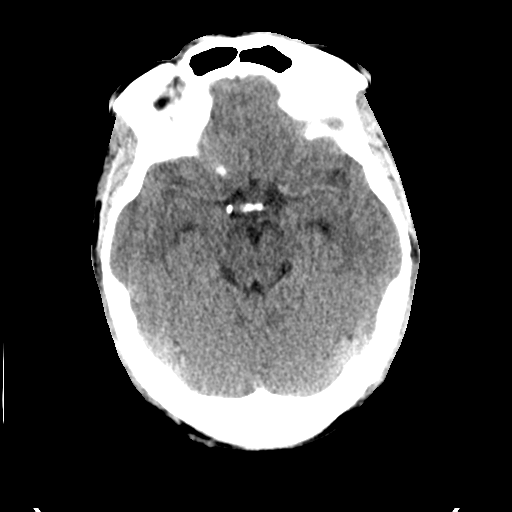
[im 18/35  brain]
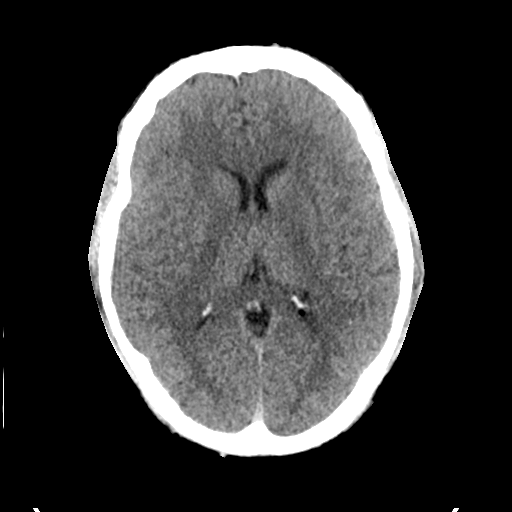
[im 22/35  brain]
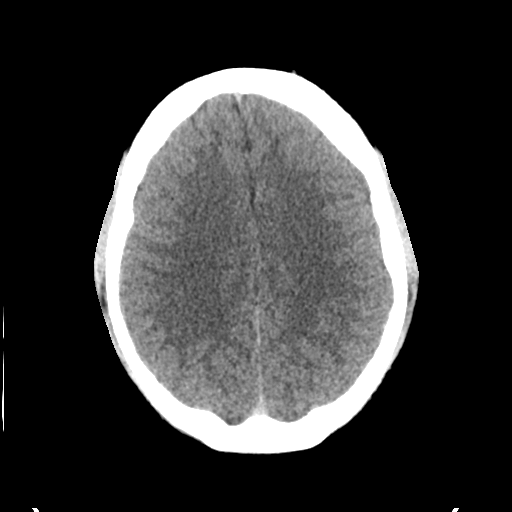
[im 22/35  bone]
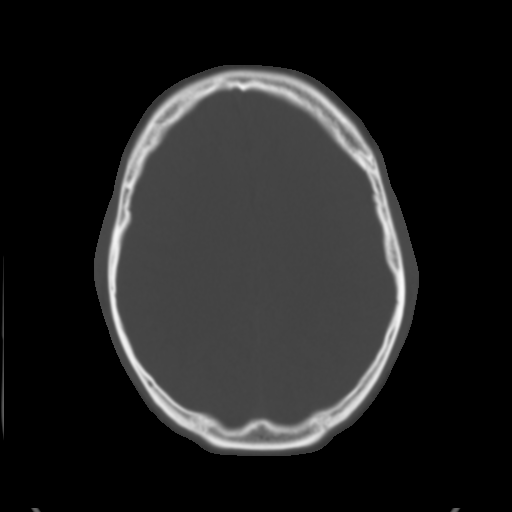
[im 26/35  brain]
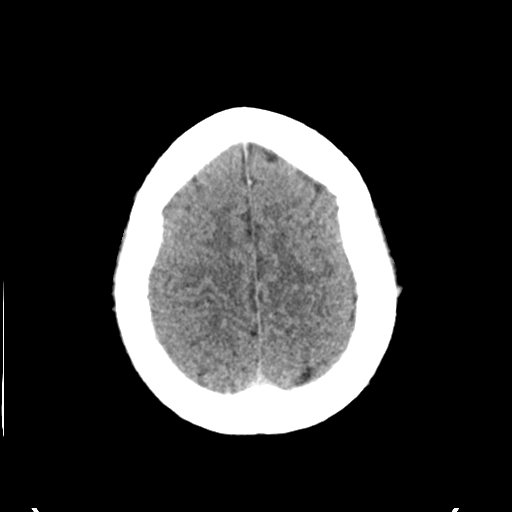
[im 30/35  brain]
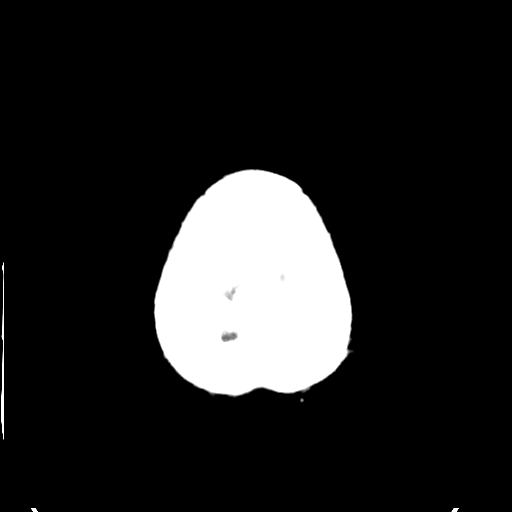

[Series 3: head bone · axial · 0.46mm/px · z∈[-108,-74]mm · 3 of 86 slices shown]
[im 9/86  bone]
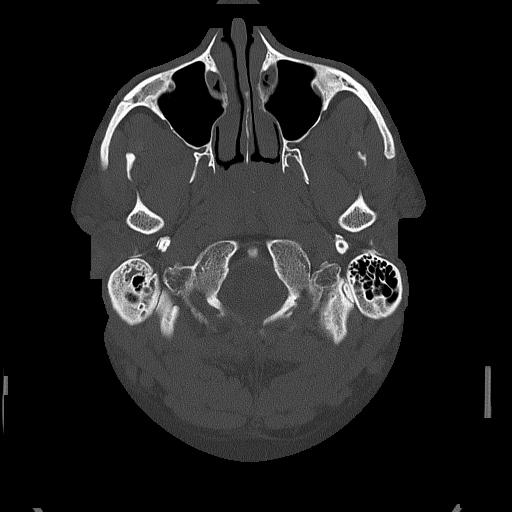
[im 18/86  bone]
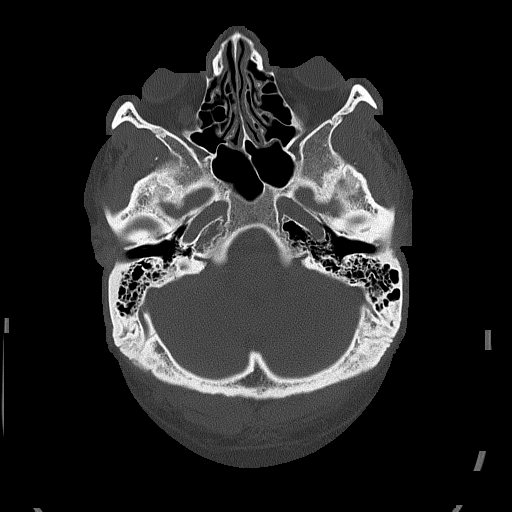
[im 26/86  bone]
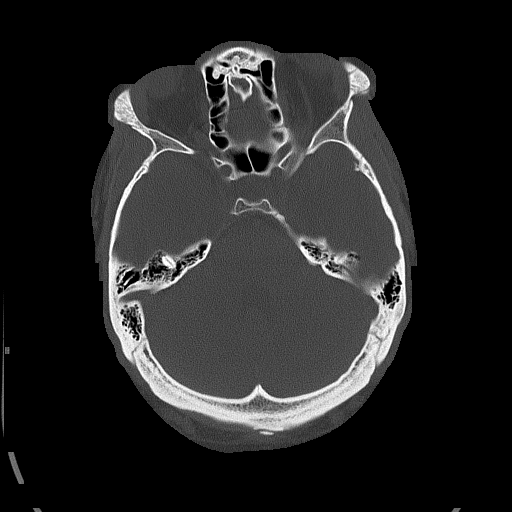

[Series 4: head without cor · coronal · non-contrast · 0.32mm/px · 3 of 73 slices shown]
[im 25/73  brain]
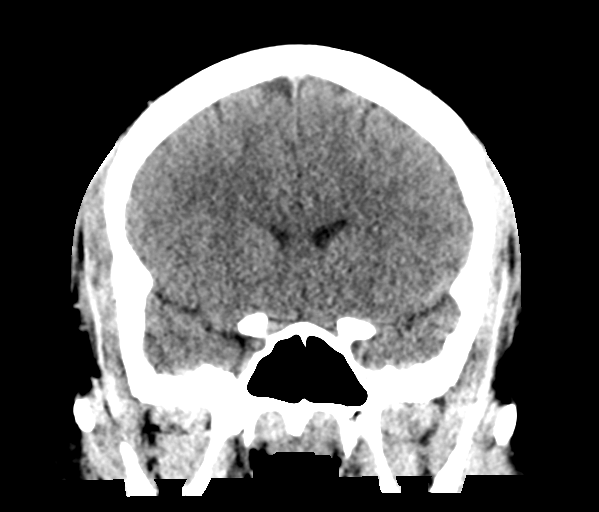
[im 33/73  brain]
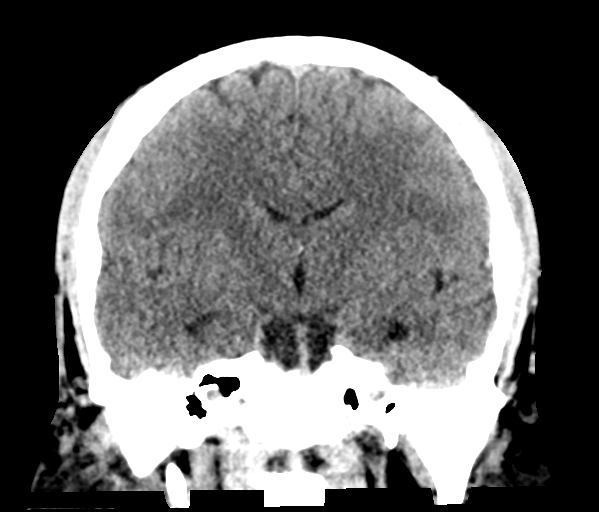
[im 41/73  brain]
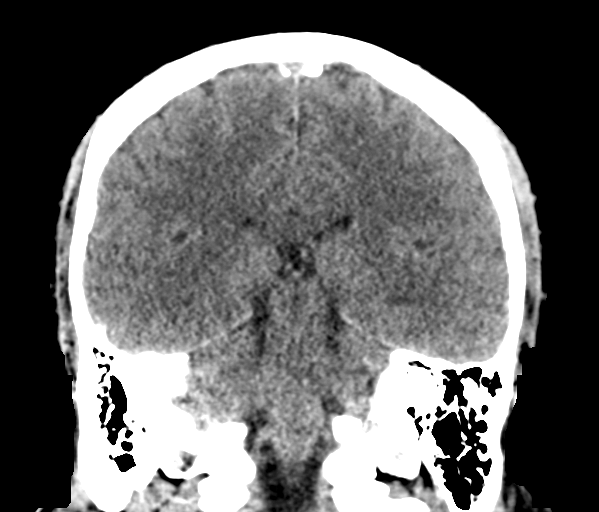

[Series 5: head without sag · sagittal · non-contrast · 0.32mm/px · 3 of 67 slices shown]
[im 12/67  brain]
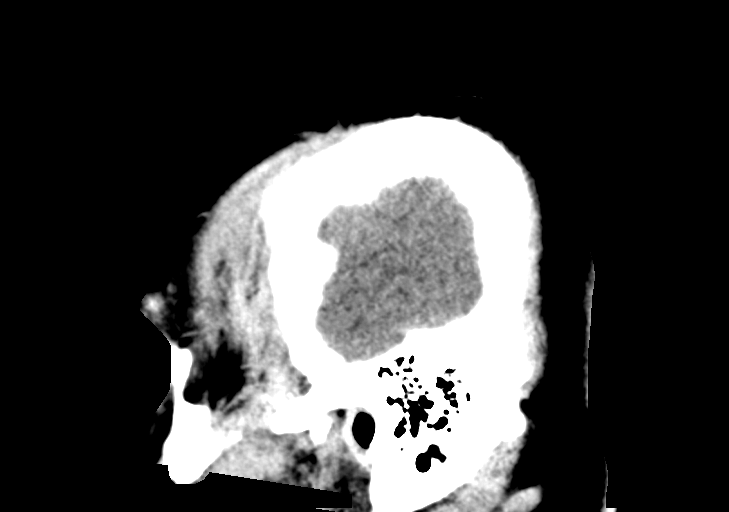
[im 23/67  brain]
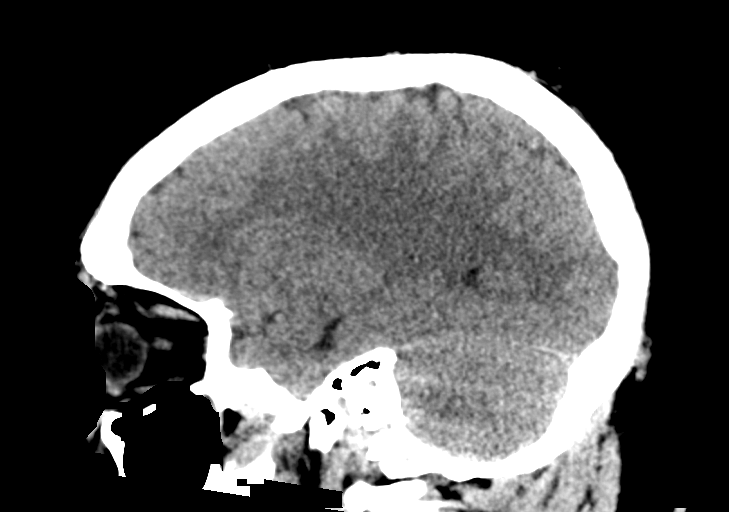
[im 34/67  brain]
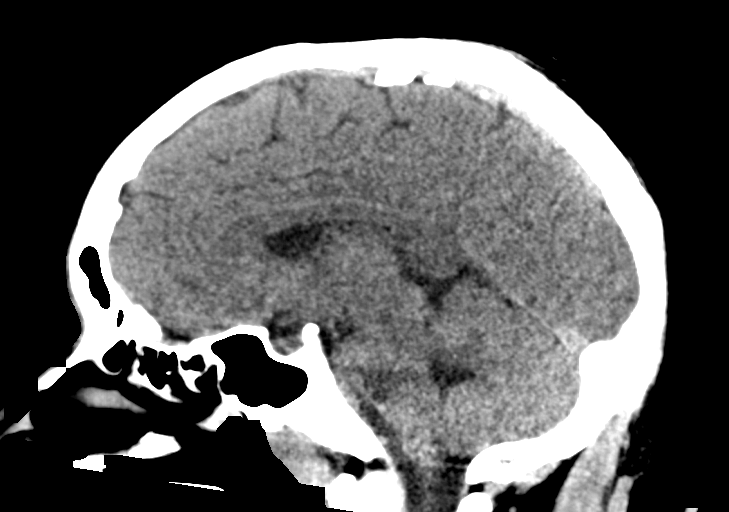

[16 of 47 positions shown; findings below may reference images not displayed]

FINDINGS: Brain: No evidence of acute infarction, hemorrhage, hydrocephalus,
extra-axial collection or mass lesion/mass effect. Gray matter and
white matter appear unremarkable, with normal differentiation.

Vascular: No hyperdense vessel or unexpected calcification.

Skull: Normal. Negative for fracture or focal lesion.

Sinuses/Orbits: No acute finding.

Other: None.
IMPRESSION: Normal brain.

## 2022-12-16 DIAGNOSIS — F902 Attention-deficit hyperactivity disorder, combined type: Secondary | ICD-10-CM | POA: Diagnosis not present

## 2023-06-07 DIAGNOSIS — F902 Attention-deficit hyperactivity disorder, combined type: Secondary | ICD-10-CM | POA: Diagnosis not present
